# Patient Record
Sex: Female | Born: 1970 | Race: White | Hispanic: No | Marital: Married | Smoking: Never smoker
Health system: Southern US, Community
[De-identification: ages and names within clinical notes are randomized; demographics above are authoritative.]

## PROBLEM LIST (undated history)

## (undated) DIAGNOSIS — E079 Disorder of thyroid, unspecified: Secondary | ICD-10-CM

## (undated) DIAGNOSIS — G43909 Migraine, unspecified, not intractable, without status migrainosus: Secondary | ICD-10-CM

## (undated) DIAGNOSIS — Z9889 Other specified postprocedural states: Secondary | ICD-10-CM

## (undated) HISTORY — DX: Migraine, unspecified, not intractable, without status migrainosus: G43.909

## (undated) HISTORY — DX: Disorder of thyroid, unspecified: E07.9

## (undated) HISTORY — PX: TUBAL LIGATION: SHX77

## (undated) HISTORY — DX: Other specified postprocedural states: Z98.890

## (undated) HISTORY — PX: GASTRIC BYPASS: SHX52

---

## 1998-03-23 ENCOUNTER — Inpatient Hospital Stay (HOSPITAL_COMMUNITY): Admission: AD | Admit: 1998-03-23 | Discharge: 1998-03-23 | Payer: Self-pay | Admitting: Obstetrics and Gynecology

## 1998-04-19 ENCOUNTER — Inpatient Hospital Stay (HOSPITAL_COMMUNITY): Admission: AD | Admit: 1998-04-19 | Discharge: 1998-04-21 | Payer: Self-pay | Admitting: Obstetrics and Gynecology

## 1998-07-14 ENCOUNTER — Ambulatory Visit (HOSPITAL_COMMUNITY): Admission: RE | Admit: 1998-07-14 | Discharge: 1998-07-14 | Payer: Self-pay | Admitting: Obstetrics and Gynecology

## 2004-08-30 ENCOUNTER — Ambulatory Visit (HOSPITAL_COMMUNITY): Admission: RE | Admit: 2004-08-30 | Discharge: 2004-08-30 | Payer: Self-pay | Admitting: Family Medicine

## 2005-02-20 ENCOUNTER — Ambulatory Visit (HOSPITAL_COMMUNITY): Admission: RE | Admit: 2005-02-20 | Discharge: 2005-02-20 | Payer: Self-pay | Admitting: Surgery

## 2005-02-21 ENCOUNTER — Encounter: Admission: RE | Admit: 2005-02-21 | Discharge: 2005-05-22 | Payer: Self-pay | Admitting: Surgery

## 2005-03-02 ENCOUNTER — Ambulatory Visit (HOSPITAL_COMMUNITY): Admission: RE | Admit: 2005-03-02 | Discharge: 2005-03-02 | Payer: Self-pay | Admitting: Surgery

## 2005-03-14 ENCOUNTER — Ambulatory Visit: Payer: Self-pay | Admitting: Psychology

## 2005-06-05 ENCOUNTER — Encounter: Admission: RE | Admit: 2005-06-05 | Discharge: 2005-09-03 | Payer: Self-pay | Admitting: Surgery

## 2005-06-26 ENCOUNTER — Inpatient Hospital Stay (HOSPITAL_COMMUNITY): Admission: RE | Admit: 2005-06-26 | Discharge: 2005-06-28 | Payer: Self-pay | Admitting: Surgery

## 2005-08-16 ENCOUNTER — Ambulatory Visit (HOSPITAL_COMMUNITY): Admission: RE | Admit: 2005-08-16 | Discharge: 2005-08-16 | Payer: Self-pay | Admitting: Surgery

## 2005-08-16 ENCOUNTER — Encounter: Admission: RE | Admit: 2005-08-16 | Discharge: 2005-08-16 | Payer: Self-pay | Admitting: Surgery

## 2005-09-18 ENCOUNTER — Encounter: Admission: RE | Admit: 2005-09-18 | Discharge: 2005-12-17 | Payer: Self-pay | Admitting: Surgery

## 2005-12-04 ENCOUNTER — Encounter: Admission: RE | Admit: 2005-12-04 | Discharge: 2005-12-04 | Payer: Self-pay | Admitting: Family Medicine

## 2006-01-22 ENCOUNTER — Encounter: Admission: RE | Admit: 2006-01-22 | Discharge: 2006-01-22 | Payer: Self-pay | Admitting: Surgery

## 2006-06-24 ENCOUNTER — Encounter: Admission: RE | Admit: 2006-06-24 | Discharge: 2006-09-22 | Payer: Self-pay | Admitting: Surgery

## 2008-04-15 ENCOUNTER — Encounter: Admission: RE | Admit: 2008-04-15 | Discharge: 2008-04-15 | Payer: Self-pay | Admitting: Family

## 2009-04-28 ENCOUNTER — Encounter: Admission: RE | Admit: 2009-04-28 | Discharge: 2009-04-28 | Payer: Self-pay | Admitting: Family Medicine

## 2011-02-02 NOTE — Op Note (Signed)
[name], [name]              ACCOUNT NO.:  [id number]   MEDICAL RECORD NO.:  [id number]          PATIENT TYPE:  INP   LOCATION:  X001                         FACILITY:  [facility]   PHYSICIAN:  Vikki Ports, MDDATE OF BIRTH:  01/03/1971   DATE OF PROCEDURE:  06/26/2005  DATE OF DISCHARGE:                                 OPERATIVE REPORT   PREOPERATIVE DIAGNOSIS:  Morbid obesity.   POSTOPERATIVE DIAGNOSIS:  Morbid obesity.   PROCEDURE:  Upper endoscopy.   ANESTHESIA:  General.   SURGEON:  Dr. Danna Hefty   DESCRIPTION:  At the conclusion of the laparoscopic Roux-en-Y gastric bypass  by Dr. Daphine Deutscher, I introduced the Olympus endoscope transorally.  On entering  the Olympus endoscope, the pouch measured 6 cm, was distended well, and it  was removed.  The remained of the report will be dictated by Dr. Daphine Deutscher.      Vikki Ports, MD  Electronically Signed     KRH/MEDQ  D:  06/26/2005  T:  06/26/2005  Job:  [id number]
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
## 2011-02-02 NOTE — Op Note (Signed)
NAMEKENDALYNN, [name]              ACCOUNT NO.:  [id number]   MEDICAL RECORD NO.:  [id number]          PATIENT TYPE:  INP   LOCATION:  X001                         FACILITY:  [facility]   PHYSICIAN:  Thornton Park. Daphine Deutscher, MD  DATE OF BIRTH:  01-27-1971   DATE OF PROCEDURE:  06/26/2005  DATE OF DISCHARGE:                                 OPERATIVE REPORT   PREOPERATIVE DIAGNOSIS:  Morbid obesity, 5 feet 6, weight 286 today.   DESCRIPTION OF PROCEDURE:  Morbid obesity, 5 feet 6, weight 286 today.   PROCEDURE:  Laparoscopic Roux-en-Y gastric bypass (40 cm biliopancreatic  limb with 100 cm Roux limb, antecolic, antegastric candy cane to the left,  closure of Peterson's defect).   SURGEON:  Thornton Park. Daphine Deutscher, MD.   ASSISTANT:  Vikki Ports, MD   ANESTHESIA:  General endotracheal.   DRAINS:  One JP in the left upper quadrant.   DESCRIPTION OF PROCEDURE:  [name] is a 40 year old lady with a BMI  of approximately 48 who was taken to room one and given general anesthesia.  The abdomen was prepped with Betadine and draped sterilely. I entered the  abdomen through the left upper quadrant with an Optiview technique, 12 mm  trocar without difficulty. The trocars were placed including two in the  right upper quadrant and one slightly to the left of  midline and another 5  mm lateral on the left side. The transverse colon was elevated and the  ligament of Treitz was identified and 40 cm downstream from the ligament of  Treitz were measured with the [facility] and it was divided. The proximal end  looked a little ischemic, so we amputated just the tip of the proximal bowel  again using a white load echelon stapler. A Penrose drain was sutured on the  Roux limb. 100 cm were then counted of Roux limb and then a side-to-side  anastomosis was created aligning the antimesenteric borders (held with a  suture) and placing the Echelon stapler and opening along antemesenteric  border firing the  stapler. The common defect was closed with 2-0 Vicryl's  from above and below tying in the middle. I then coated he closure with  Tisseel. The common defect and the mesenteric defect were closed with  running 2-0 silk.   The omentum was divided; and we went up to create the new gastric pouch.  This was dissected up near the spleen and then I went proximally what  appeared would be 4 cm down on the lesser curvature below a vessel and  dissected in behind the lesser curvature. The Echelon with a bronze  cartridge was inserted and fired. I then placed a second application and  locking down and holding it and then firing it, it seemed to jam and it  looked like a tooth of the Prestige grasper may have been in the jaws of the  stapler. The Echelon was withdrawn and a second application was placed right  along the line and fired. I then buttressed the remnant with a running 2-0  Vicryl in the area where this application had been  made. There was no  evidence of any kind of perforation in either the remnant OR the gastric  pouch. I went ahead and completed the pouch creation again using the Echelon  stapler.  The stomach was completely divided and the proximal staple lines  were coated with Tisseal.   The Roux limb was then brought up and a posterior row was made to the staple  line going back up to the lessor curvature side were the initial firing had  been. An opening was made, the echelon was inserted and fired. The common  defect was closed with running 2-0 Vicryl and then a second layer to that  was made to complete the two-layer anastomosis. We then clamped off the  bowel and Dr. Luan Pulling went up and endoscoped the patient. Prior to doing  that, I did close Peterson's defect with a 2-0 silk. This was sewn through  the mesentery and then  tacked to the colon and to the Roux limb. After the  insufflation with the bowel clamp, I submerged the pouch and no leaks were  noted. There was no  bleeding inside the pouch or bleeding on the outside the  pouch. I then placed a drain up above this into the left upper quadrant  brought out through the left side. It was sutured to the skin with 3-0  nylon. Tisseel was applied to the gastrojejunostomy and some extra was  placed down in the closure of Peterson's defect. The patient seemed to  tolerate the procedure well. The abdomen was deflated and the wounds were  closed with 4-0 Vicryl and also staples. The patient tolerated the procedure  well. She was taken to the recovery room in satisfactory condition.      Thornton Park Daphine Deutscher, MD  Electronically Signed     MBM/MEDQ  D:  06/26/2005  T:  06/26/2005  Job:  [id number]   cc:   Teena Irani. Arlyce Dice, M.D.  Fax: [phone]

## 2011-10-23 ENCOUNTER — Other Ambulatory Visit: Payer: Self-pay | Admitting: Family Medicine

## 2011-10-23 DIAGNOSIS — Z1231 Encounter for screening mammogram for malignant neoplasm of breast: Secondary | ICD-10-CM

## 2011-11-14 ENCOUNTER — Ambulatory Visit
Admission: RE | Admit: 2011-11-14 | Discharge: 2011-11-14 | Disposition: A | Discharge status: Home / Self Care | Payer: 59 | Source: Ambulatory Visit | Attending: Family Medicine | Admitting: Family Medicine

## 2011-11-14 DIAGNOSIS — Z1231 Encounter for screening mammogram for malignant neoplasm of breast: Secondary | ICD-10-CM

## 2013-02-24 ENCOUNTER — Other Ambulatory Visit (HOSPITAL_COMMUNITY): Payer: Self-pay | Admitting: Family Medicine

## 2013-02-24 DIAGNOSIS — Z139 Encounter for screening, unspecified: Secondary | ICD-10-CM

## 2013-02-26 ENCOUNTER — Ambulatory Visit (HOSPITAL_COMMUNITY)
Admission: RE | Admit: 2013-02-26 | Discharge: 2013-02-26 | Disposition: A | Discharge status: Home / Self Care | Payer: 59 | Source: Ambulatory Visit | Attending: Family Medicine | Admitting: Family Medicine

## 2013-02-26 DIAGNOSIS — Z1231 Encounter for screening mammogram for malignant neoplasm of breast: Secondary | ICD-10-CM | POA: Insufficient documentation

## 2013-02-26 DIAGNOSIS — Z139 Encounter for screening, unspecified: Secondary | ICD-10-CM

## 2013-09-28 ENCOUNTER — Other Ambulatory Visit (HOSPITAL_COMMUNITY): Payer: Self-pay | Admitting: Family Medicine

## 2013-09-28 DIAGNOSIS — R109 Unspecified abdominal pain: Secondary | ICD-10-CM

## 2013-09-28 DIAGNOSIS — R197 Diarrhea, unspecified: Secondary | ICD-10-CM

## 2013-10-01 ENCOUNTER — Ambulatory Visit (HOSPITAL_COMMUNITY)
Admission: RE | Admit: 2013-10-01 | Discharge: 2013-10-01 | Disposition: A | Discharge status: Home / Self Care | Payer: 59 | Source: Ambulatory Visit | Attending: Family Medicine | Admitting: Family Medicine

## 2013-10-01 ENCOUNTER — Ambulatory Visit (HOSPITAL_COMMUNITY): Payer: 59

## 2013-10-01 DIAGNOSIS — R109 Unspecified abdominal pain: Secondary | ICD-10-CM

## 2013-10-01 DIAGNOSIS — R1012 Left upper quadrant pain: Secondary | ICD-10-CM | POA: Insufficient documentation

## 2013-10-01 DIAGNOSIS — R197 Diarrhea, unspecified: Secondary | ICD-10-CM

## 2013-10-01 MED ORDER — SODIUM CHLORIDE 0.9 % IJ SOLN
INTRAMUSCULAR | Status: AC
  Filled 2013-10-01: qty 500

## 2013-10-01 MED ORDER — IOHEXOL 300 MG/ML  SOLN
100.0000 mL | Freq: Once | INTRAMUSCULAR | Status: AC | PRN
  Administered 2013-10-01: 100 mL via INTRAVENOUS

## 2013-11-30 ENCOUNTER — Ambulatory Visit (INDEPENDENT_AMBULATORY_CARE_PROVIDER_SITE_OTHER): Payer: 59 | Admitting: General Surgery

## 2013-12-08 ENCOUNTER — Encounter (INDEPENDENT_AMBULATORY_CARE_PROVIDER_SITE_OTHER): Payer: Self-pay

## 2015-01-31 ENCOUNTER — Other Ambulatory Visit (HOSPITAL_COMMUNITY): Payer: Self-pay | Admitting: Family Medicine

## 2015-01-31 DIAGNOSIS — Z1231 Encounter for screening mammogram for malignant neoplasm of breast: Secondary | ICD-10-CM

## 2015-02-28 ENCOUNTER — Ambulatory Visit (HOSPITAL_COMMUNITY)
Admission: RE | Admit: 2015-02-28 | Discharge: 2015-02-28 | Disposition: A | Discharge status: Home / Self Care | Payer: 59 | Source: Ambulatory Visit | Attending: Family Medicine | Admitting: Family Medicine

## 2015-02-28 DIAGNOSIS — Z1231 Encounter for screening mammogram for malignant neoplasm of breast: Secondary | ICD-10-CM | POA: Diagnosis present

## 2017-05-08 ENCOUNTER — Other Ambulatory Visit (HOSPITAL_COMMUNITY): Payer: Self-pay | Admitting: Family Medicine

## 2017-05-08 DIAGNOSIS — Z1231 Encounter for screening mammogram for malignant neoplasm of breast: Secondary | ICD-10-CM

## 2017-05-13 ENCOUNTER — Ambulatory Visit (HOSPITAL_COMMUNITY): Payer: 59

## 2018-07-23 ENCOUNTER — Other Ambulatory Visit (HOSPITAL_COMMUNITY): Payer: Self-pay | Admitting: Family Medicine

## 2018-07-23 DIAGNOSIS — Z1231 Encounter for screening mammogram for malignant neoplasm of breast: Secondary | ICD-10-CM

## 2018-07-28 ENCOUNTER — Ambulatory Visit (HOSPITAL_COMMUNITY)
Admission: RE | Admit: 2018-07-28 | Discharge: 2018-07-28 | Disposition: A | Discharge status: Home / Self Care | Payer: 59 | Source: Ambulatory Visit | Attending: Family Medicine | Admitting: Family Medicine

## 2018-07-28 DIAGNOSIS — Z1231 Encounter for screening mammogram for malignant neoplasm of breast: Secondary | ICD-10-CM | POA: Insufficient documentation

## 2019-11-19 ENCOUNTER — Ambulatory Visit: Payer: 59 | Attending: Internal Medicine

## 2019-11-19 DIAGNOSIS — Z23 Encounter for immunization: Secondary | ICD-10-CM | POA: Insufficient documentation

## 2019-11-19 NOTE — Progress Notes (Signed)
   Covid-19 Vaccination Clinic  Name:  MCKAELA HOWLEY    MRN: [id number] DOB: 1971-07-17  11/19/2019  Ms. Igoe was observed post Covid-19 immunization for 15 minutes without incident. She was provided with Vaccine Information Sheet and instruction to access the V-Safe system.   Ms. Herskowitz was instructed to call 911 with any severe reactions post vaccine: Marland Kitchen Difficulty breathing  . Swelling of face and throat  . A fast heartbeat  . A bad rash all over body  . Dizziness and weakness   Immunizations Administered    Name Date Dose VIS Date Route   Moderna COVID-19 Vaccine 11/19/2019  9:12 AM 0.5 mL 08/18/2019 Intramuscular   Manufacturer: Moderna   Lot: [id number]   NDC: 39532-023-34

## 2019-12-22 ENCOUNTER — Other Ambulatory Visit (HOSPITAL_COMMUNITY): Payer: Self-pay | Admitting: Family Medicine

## 2019-12-22 DIAGNOSIS — Z1231 Encounter for screening mammogram for malignant neoplasm of breast: Secondary | ICD-10-CM

## 2019-12-23 ENCOUNTER — Ambulatory Visit: Payer: 59 | Attending: Internal Medicine

## 2019-12-23 DIAGNOSIS — Z23 Encounter for immunization: Secondary | ICD-10-CM

## 2019-12-23 NOTE — Progress Notes (Signed)
   Covid-19 Vaccination Clinic  Name:  [name]    MRN: [id number] DOB: October 10, 1970  12/23/2019  [name] was observed post Covid-19 immunization for 15 minutes without incident. She was provided with Vaccine Information Sheet and instruction to access the V-Safe system.   [name] was instructed to call 911 with any severe reactions post vaccine: Marland Kitchen Difficulty breathing  . Swelling of face and throat  . A fast heartbeat  . A bad rash all over body  . Dizziness and weakness   Immunizations Administered    Name Date Dose VIS Date Route   Moderna COVID-19 Vaccine 12/23/2019  8:11 AM 0.5 mL 08/18/2019 Intramuscular   Manufacturer: Gala Murdoch   Lot: [id number]   NDC: 93810-175-10

## 2020-02-01 ENCOUNTER — Ambulatory Visit (HOSPITAL_COMMUNITY): Payer: 59

## 2020-02-01 ENCOUNTER — Other Ambulatory Visit: Payer: Self-pay

## 2020-02-01 ENCOUNTER — Ambulatory Visit (HOSPITAL_COMMUNITY)
Admission: RE | Admit: 2020-02-01 | Discharge: 2020-02-01 | Disposition: A | Discharge status: Home / Self Care | Payer: 59 | Source: Ambulatory Visit | Attending: Family Medicine | Admitting: Family Medicine

## 2020-02-01 DIAGNOSIS — Z1231 Encounter for screening mammogram for malignant neoplasm of breast: Secondary | ICD-10-CM | POA: Insufficient documentation

## 2020-05-19 ENCOUNTER — Other Ambulatory Visit (HOSPITAL_COMMUNITY): Payer: Self-pay | Admitting: Family Medicine

## 2020-05-19 ENCOUNTER — Other Ambulatory Visit: Payer: Self-pay | Admitting: Family Medicine

## 2020-05-19 DIAGNOSIS — N63 Unspecified lump in unspecified breast: Secondary | ICD-10-CM

## 2020-05-24 ENCOUNTER — Other Ambulatory Visit: Payer: Self-pay

## 2020-05-24 ENCOUNTER — Ambulatory Visit (HOSPITAL_COMMUNITY)
Admission: RE | Admit: 2020-05-24 | Discharge: 2020-05-24 | Disposition: A | Discharge status: Home / Self Care | Payer: 59 | Source: Ambulatory Visit | Attending: Family Medicine | Admitting: Family Medicine

## 2020-05-24 DIAGNOSIS — N63 Unspecified lump in unspecified breast: Secondary | ICD-10-CM

## 2020-05-25 ENCOUNTER — Other Ambulatory Visit (HOSPITAL_COMMUNITY): Payer: Self-pay | Admitting: Family Medicine

## 2020-05-25 DIAGNOSIS — R928 Other abnormal and inconclusive findings on diagnostic imaging of breast: Secondary | ICD-10-CM

## 2020-06-01 ENCOUNTER — Other Ambulatory Visit: Payer: Self-pay

## 2020-06-01 ENCOUNTER — Ambulatory Visit (HOSPITAL_COMMUNITY)
Admission: RE | Admit: 2020-06-01 | Discharge: 2020-06-01 | Disposition: A | Discharge status: Home / Self Care | Payer: 59 | Source: Ambulatory Visit | Attending: Family Medicine | Admitting: Family Medicine

## 2020-06-01 DIAGNOSIS — R928 Other abnormal and inconclusive findings on diagnostic imaging of breast: Secondary | ICD-10-CM | POA: Diagnosis present

## 2020-06-01 DIAGNOSIS — R599 Enlarged lymph nodes, unspecified: Secondary | ICD-10-CM | POA: Diagnosis not present

## 2020-06-01 MED ORDER — LIDOCAINE-EPINEPHRINE (PF) 1 %-1:200000 IJ SOLN
INTRAMUSCULAR | Status: AC
  Filled 2020-06-01: qty 30

## 2020-06-01 MED ORDER — LIDOCAINE HCL (PF) 2 % IJ SOLN
INTRAMUSCULAR | Status: AC
  Filled 2020-06-01: qty 10

## 2020-06-06 LAB — SURGICAL PATHOLOGY

## 2020-06-07 ENCOUNTER — Other Ambulatory Visit (HOSPITAL_COMMUNITY): Payer: 59

## 2021-01-18 LAB — EXTERNAL GENERIC LAB PROCEDURE: COLOGUARD: NEGATIVE

## 2021-09-21 IMAGING — MG MM DIGITAL DIAGNOSTIC UNILAT*L* W/ TOMO W/ CAD
8 series · 8 of 24 positions shown · non-contrast
Comparison: Previous exams.

CLINICAL DATA: 49-year-old female with a palpable area of concern
in the left axilla. She noticed this palpable area of concern in the
left axilla approximately 1 week ago which is associated with
tenderness.

EXAM:
DIGITAL DIAGNOSTIC UNILATERAL LEFT MAMMOGRAM WITH TOMO AND CAD;
ULTRASOUND LEFT BREAST LIMITED

[L CC synth-2D]
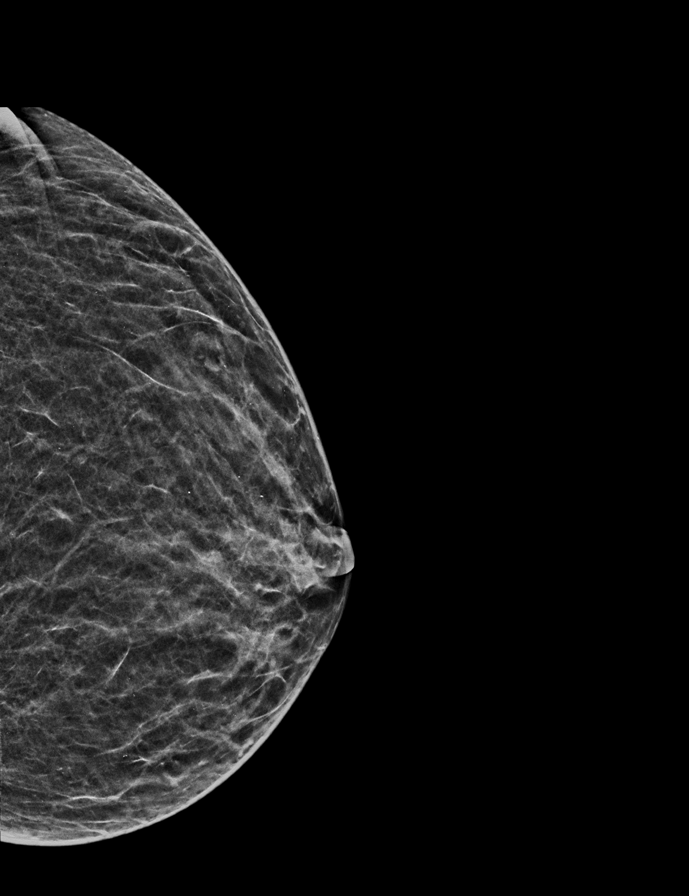

[L MLO synth-2D (1 of 3)]
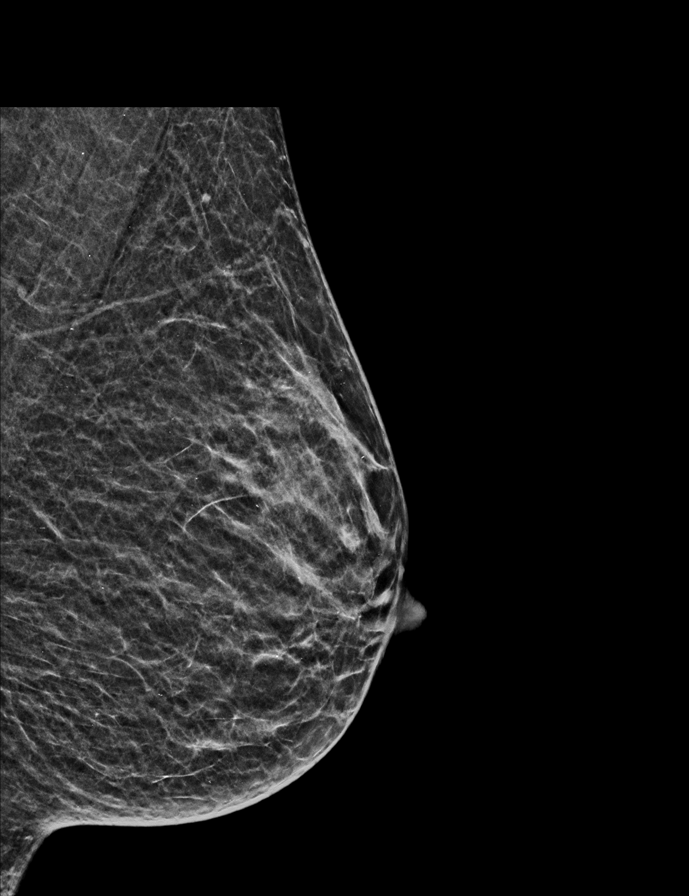

[L MLO synth-2D (2 of 3)]
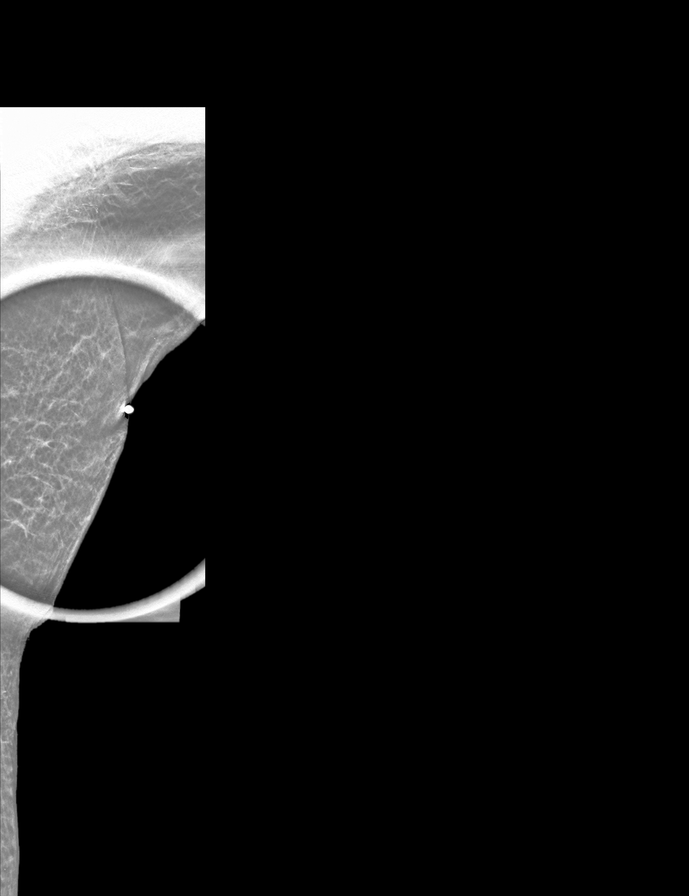

[L MLO synth-2D (3 of 3)]
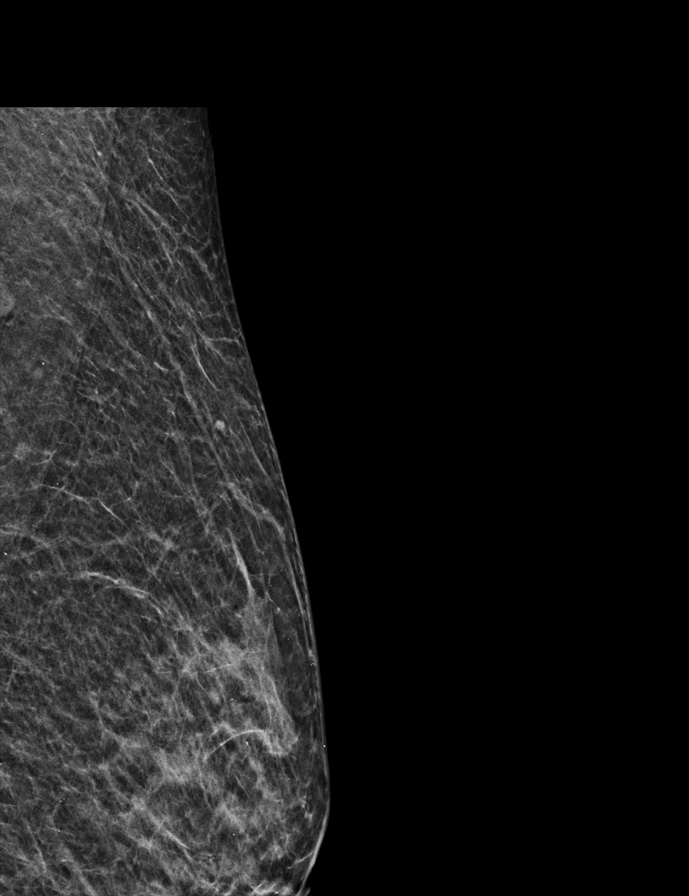

[L MLO tomo (1 of 3) · tomo slice 17/34.0]
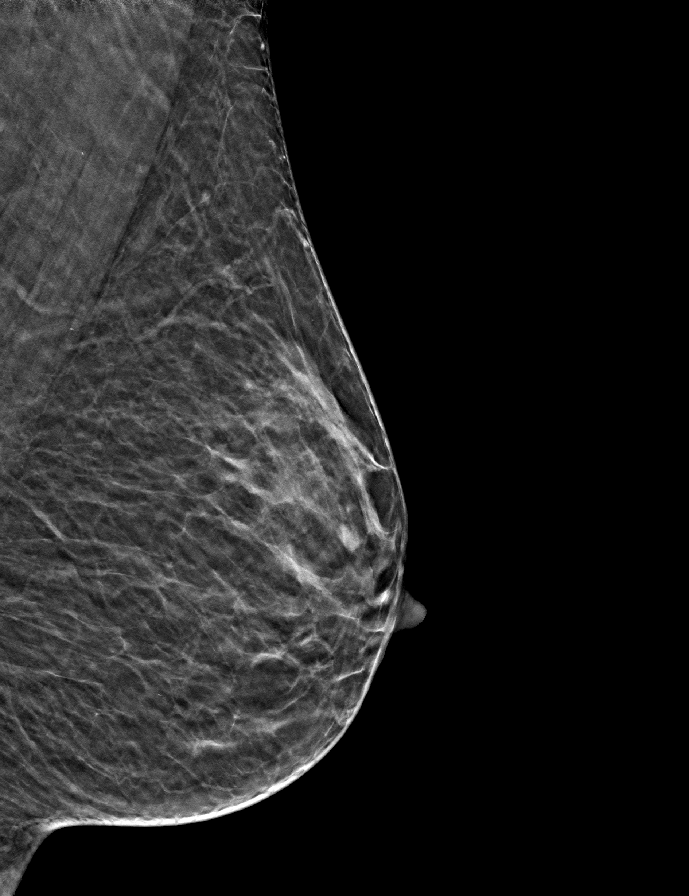

[L MLO tomo (2 of 3) · tomo slice 17/34.0]
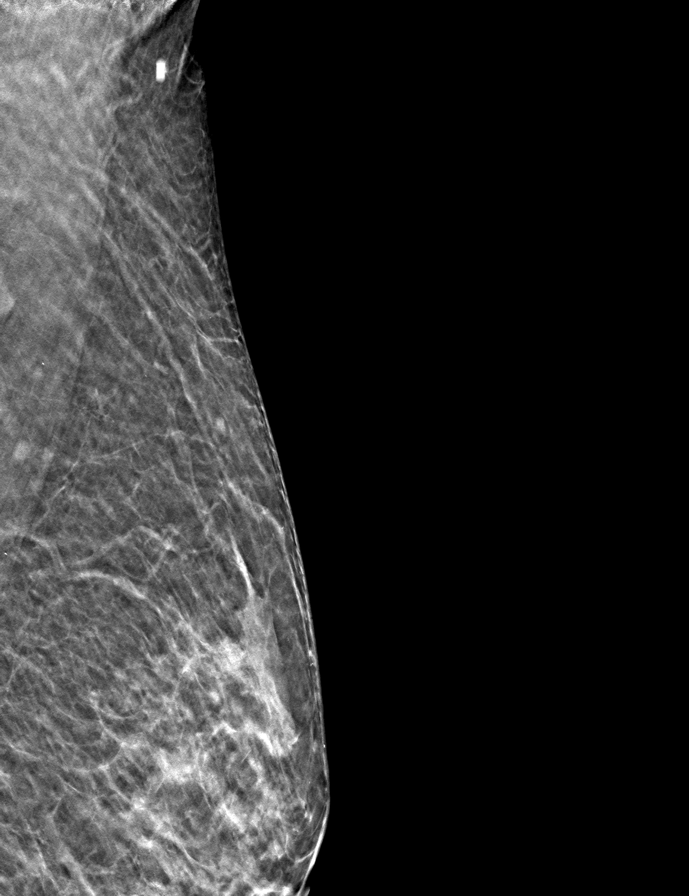

[L MLO tomo (3 of 3) · tomo slice 11/21.0]
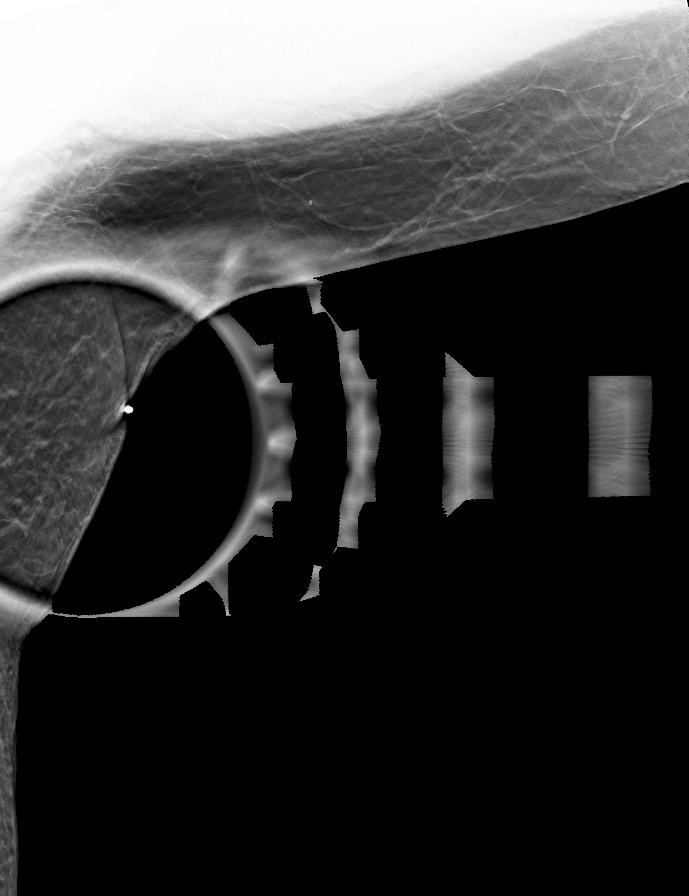

[L CC tomo · tomo slice 19/37.0]
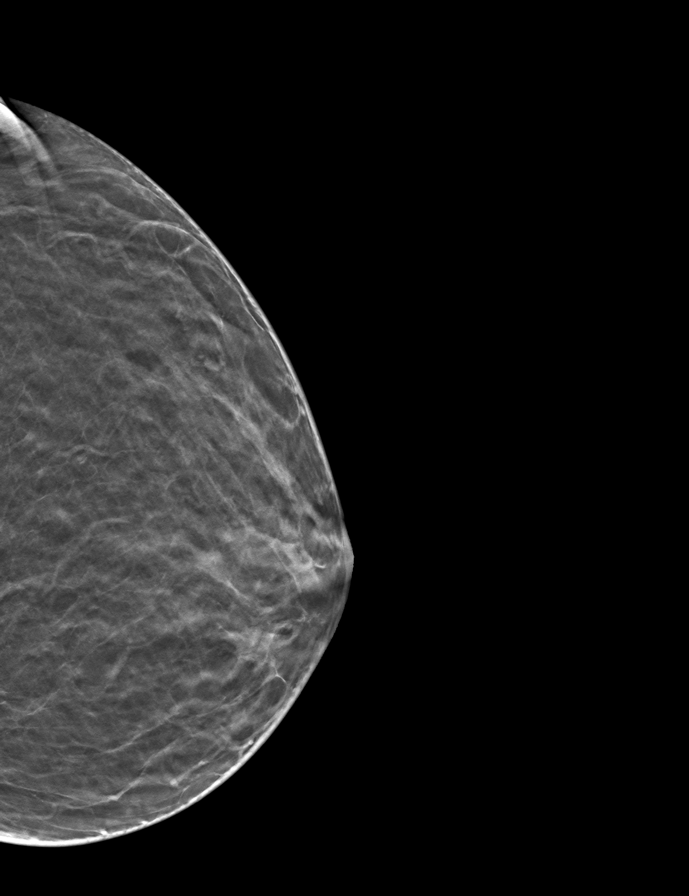

[8 of 24 positions shown; findings below may reference images not displayed]

ACR Breast Density Category c: The breast tissue is heterogeneously
dense, which may obscure small masses.
FINDINGS: No suspicious masses or calcifications seen in the left breast. Spot
compression tomograms were performed over the palpable area concern
in the left axilla with no definite abnormality seen.

Mammographic images were processed with CAD.

Physical examination at site of palpable concern reveals a a
slightly mobile mass within the deep axilla.

Targeted ultrasound of the left axilla was performed demonstrating
at least 3 morphologically abnormal lymph nodes, 1 of which measures
2.2 x 1.4 x 2.3 cm with a cortex thickness of 0.5 cm. A brief scan
of the right axilla demonstrates few small lymph nodes, all of
normal morphology.
IMPRESSION: Palpable left axillary lymphadenopathy.

RECOMMENDATION:
Recommend ultrasound-guided biopsy of 1 of the morphologically
abnormal lymph nodes in the left axilla. This is scheduled for
06/07/2020 at 1 p.m.

I have discussed the findings and recommendations with the patient.
If applicable, a reminder letter will be sent to the patient
regarding the next appointment.

BI-RADS CATEGORY  4: Suspicious.

## 2021-09-29 IMAGING — US US BREAST BX W LOC DEV 1ST LESION IMG BX SPEC US GUIDE*L*
1 series · 12 of 15 positions shown · non-contrast
Comparison: Previous exam(s).
COMPARISON: Previous exam(s).

Addendum:
CLINICAL DATA: 49-year-old female with unilateral left axillary
lymphadenopathy.

EXAM:
US AXILLARY NODE CORE BIOPSY LEFT

[Series 1: us breast bx w loc dev 1st lesion img bx spec us g · 0.07mm/px · 12 of 15 slices shown]
[im 1/15]
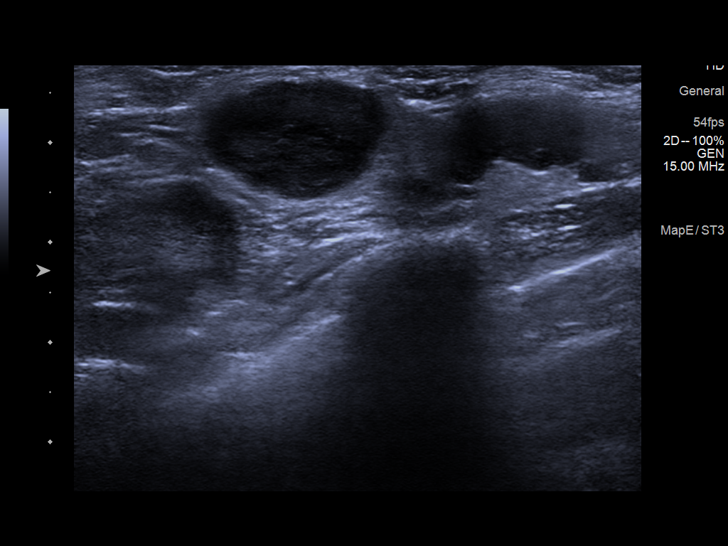
[im 2/15]
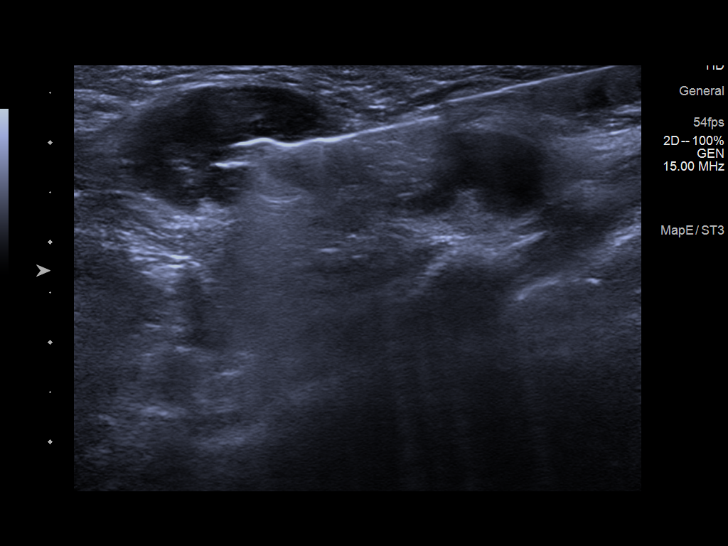
[im 4/15]
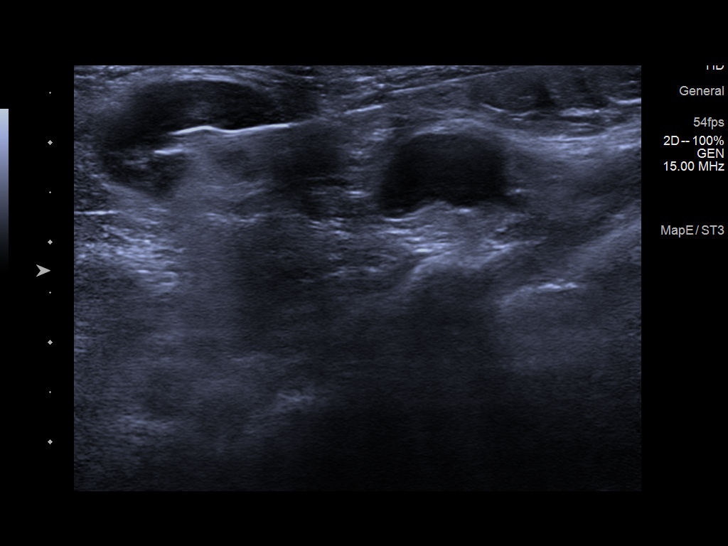
[im 5/15]
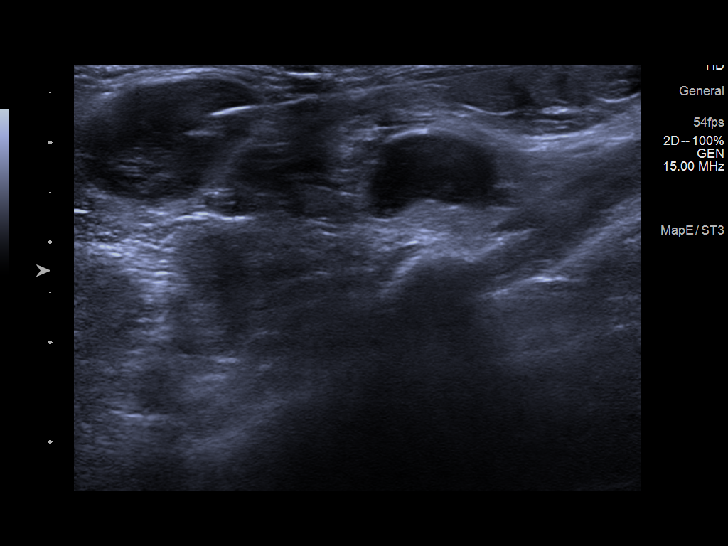
[im 6/15]
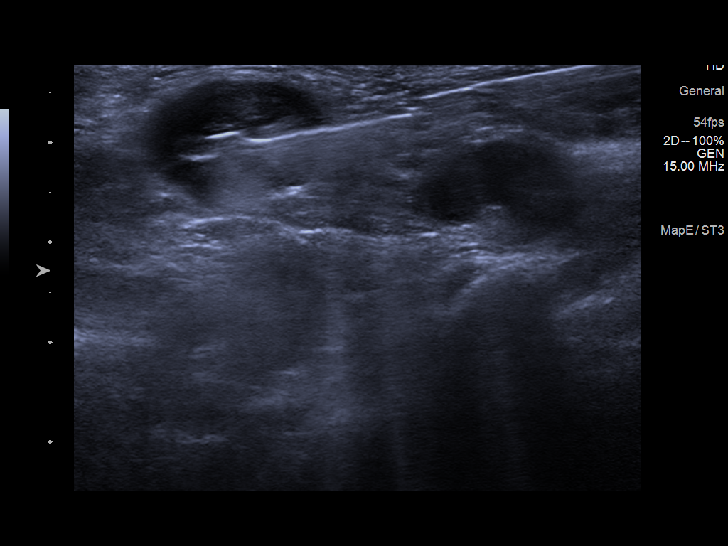
[im 7/15]
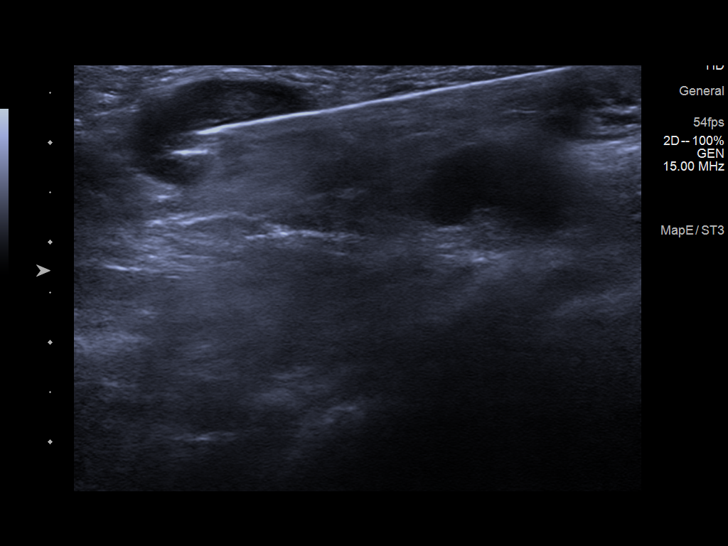
[im 9/15]
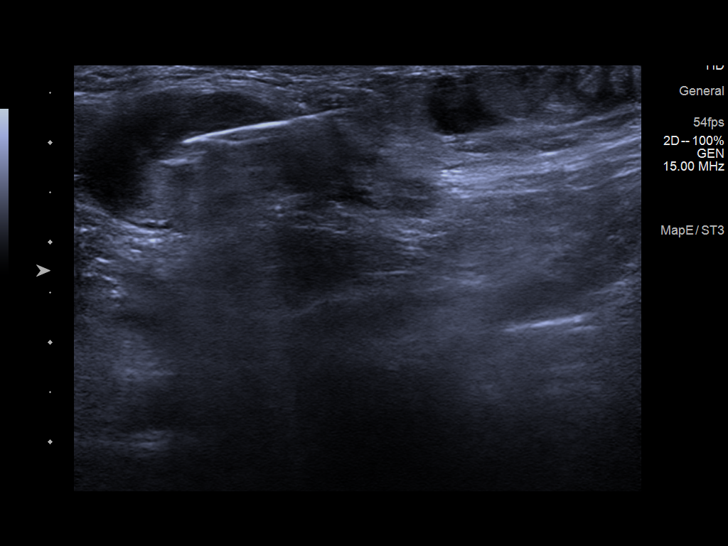
[im 10/15]
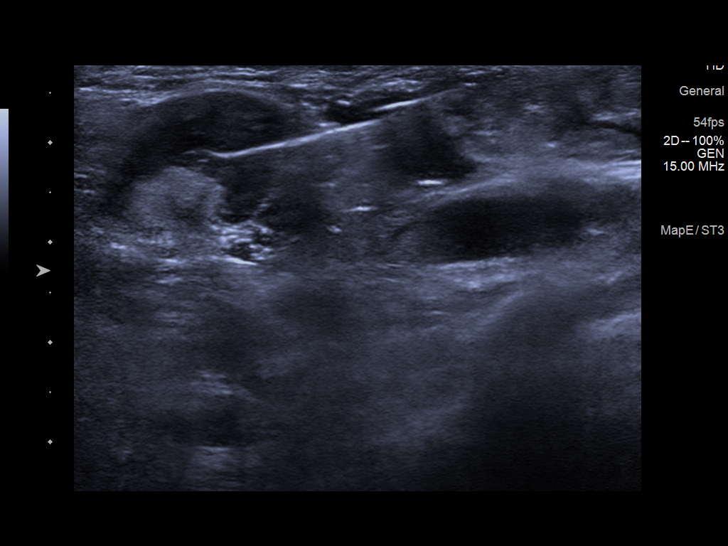
[im 11/15]
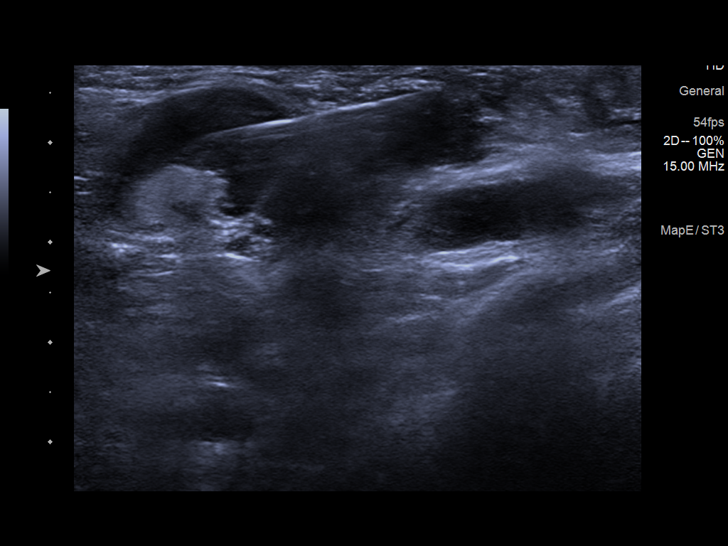
[im 12/15]
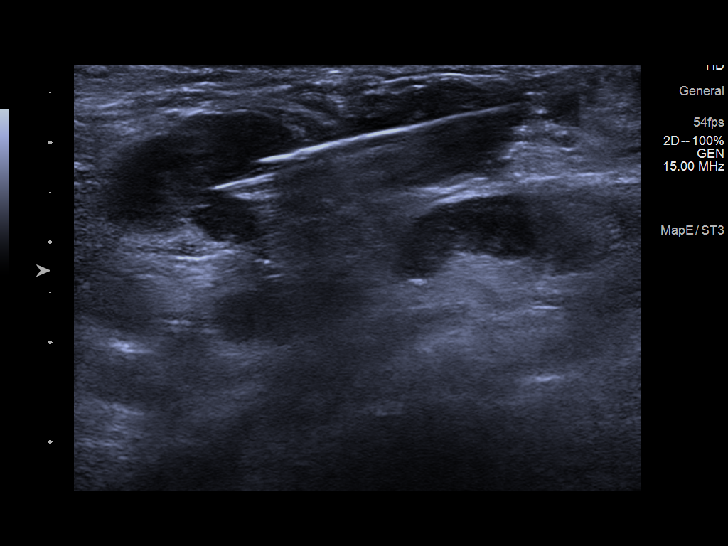
[im 14/15]
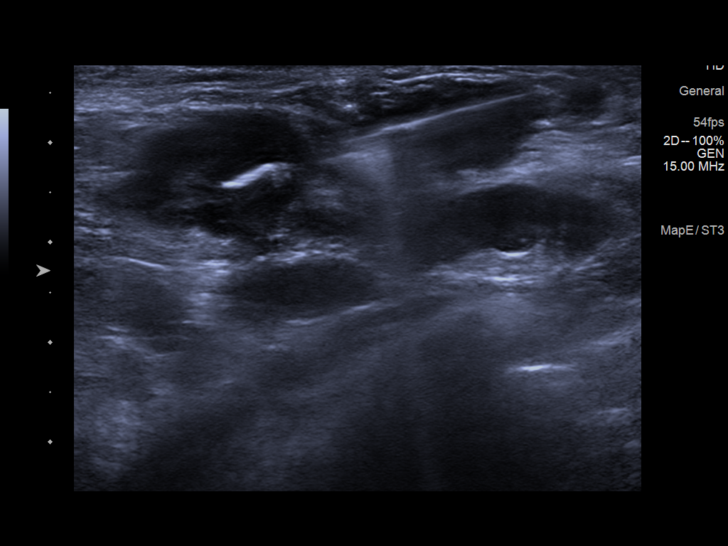
[im 15/15]
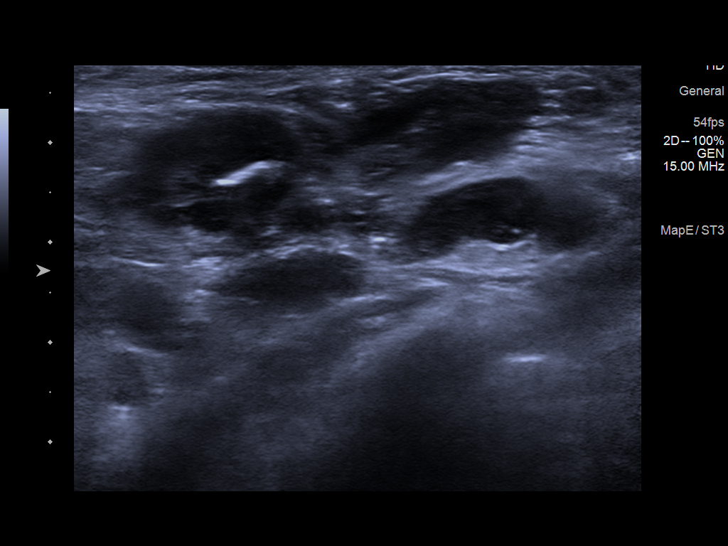

[12 of 15 positions shown; findings below may reference images not displayed]



Using sterile technique and 1% Lidocaine as local anesthetic, under
direct ultrasound visualization, a 14 gauge Yonchano device was
used to perform biopsy of 1 of the abnormal lymph nodes in the left
axilla using a inferolateral to superomedial approach. At the
conclusion of the procedure a HydroMARK tissue marker clip was
deployed into the biopsy cavity. Follow up 2 view mammogram was
performed and dictated separately.
IMPRESSION: Ultrasound guided biopsy of 1 of the abnormal lymph nodes in the
left axilla. No apparent complications.

ADDENDUM:
PATHOLOGY revealed: A. LYMPH NODE, AXILLA, LEFT, BIOPSY: - Reactive
lymph node. - No carcinoma identified. COMMENT: Core biopsies of
lymph node reveal overall intact architecture with a few reactive
Yo Mariee. There focal non-caseating granulomas and capsular
fibrosis. Immunohistochemistry reveals cortical B-cell areas (CD20,
CHOLA LITHOS5) and paracortical T-cells (CD3). CD15 highlights scattered
granulocytes. CD30 is positive in scattered activated cells. Flow
cytometry ([id number]) is negative for a monoclonal B-cell or
phenotypically aberrant T-cell population. While the pattern is
non-specific, the overall findings are consistent with a reactive
lymph node.

Pathology results are CONCORDANT with imaging findings, per Dr.
Sawyer Gzz.

Pathology results and recommendations below were discussed with
patient by telephone on 06/06/2020. Patient reported biopsy site
within normal limits with slight tenderness at the site. Post biopsy
care instructions were reviewed, questions were answered and my
direct phone number was provided to patient. Patient was instructed
to call [HOSPITAL] [HOSPITAL] Mammography Department if
any concerns or questions arise related to the biopsy.

Recommendation: Patient instructed to resume annual bilateral
screening mammogram due January 2021.

Pathology results reported by Olimpia Tiger RN on 06/07/2020.



Using sterile technique and 1% Lidocaine as local anesthetic, under
direct ultrasound visualization, a 14 gauge Yonchano device was
used to perform biopsy of 1 of the abnormal lymph nodes in the left
axilla using a inferolateral to superomedial approach. At the
conclusion of the procedure a HydroMARK tissue marker clip was
deployed into the biopsy cavity. Follow up 2 view mammogram was
performed and dictated separately.
IMPRESSION: Ultrasound guided biopsy of 1 of the abnormal lymph nodes in the
left axilla. No apparent complications.

## 2021-12-26 ENCOUNTER — Other Ambulatory Visit (HOSPITAL_COMMUNITY): Payer: Self-pay | Admitting: Family Medicine

## 2021-12-26 DIAGNOSIS — Z1231 Encounter for screening mammogram for malignant neoplasm of breast: Secondary | ICD-10-CM

## 2021-12-29 ENCOUNTER — Encounter: Payer: Self-pay | Admitting: Neurology

## 2022-01-01 ENCOUNTER — Ambulatory Visit (HOSPITAL_COMMUNITY): Payer: 59

## 2022-01-01 ENCOUNTER — Ambulatory Visit (HOSPITAL_COMMUNITY)
Admission: RE | Admit: 2022-01-01 | Discharge: 2022-01-01 | Disposition: A | Discharge status: Home / Self Care | Payer: 59 | Source: Ambulatory Visit | Attending: Family Medicine | Admitting: Family Medicine

## 2022-01-01 DIAGNOSIS — Z1231 Encounter for screening mammogram for malignant neoplasm of breast: Secondary | ICD-10-CM | POA: Diagnosis present

## 2022-03-12 ENCOUNTER — Ambulatory Visit (INDEPENDENT_AMBULATORY_CARE_PROVIDER_SITE_OTHER): Payer: 59 | Admitting: Neurology

## 2022-03-12 ENCOUNTER — Encounter: Payer: Self-pay | Admitting: Neurology

## 2022-03-12 VITALS — BP 118/74 | HR 59 | Ht 65.0 in | Wt 160.2 lb

## 2022-03-12 DIAGNOSIS — G43009 Migraine without aura, not intractable, without status migrainosus: Secondary | ICD-10-CM | POA: Diagnosis not present

## 2022-03-12 MED ORDER — SUMATRIPTAN SUCCINATE 100 MG PO TABS: 100.0000 mg | ORAL_TABLET | Freq: Once | ORAL | 2 refills | Status: DC | PRN

## 2022-03-12 MED ORDER — NORTRIPTYLINE HCL 10 MG PO CAPS: 10.0000 mg | ORAL_CAPSULE | Freq: Every day | ORAL | 5 refills | Status: DC

## 2022-03-26 ENCOUNTER — Ambulatory Visit
Admission: RE | Admit: 2022-03-26 | Discharge: 2022-03-26 | Disposition: A | Discharge status: Home / Self Care | Payer: 59 | Source: Ambulatory Visit | Attending: Neurology | Admitting: Neurology

## 2022-03-26 DIAGNOSIS — G43009 Migraine without aura, not intractable, without status migrainosus: Secondary | ICD-10-CM

## 2022-03-26 MED ORDER — GADOBENATE DIMEGLUMINE 529 MG/ML IV SOLN
15.0000 mL | Freq: Once | INTRAVENOUS | Status: AC | PRN
  Administered 2022-03-26: 15 mL via INTRAVENOUS

## 2022-03-27 ENCOUNTER — Telehealth: Payer: Self-pay

## 2022-03-27 NOTE — Telephone Encounter (Signed)
Called patient to give her the MRI results and she wanted to ask me if [facility] had contacted [place]. There is a contraindication with the Nortriptyline and the Bupropion. Is there another medication you would like to use for this patient?

## 2022-03-29 NOTE — Telephone Encounter (Signed)
Called pharmacy and they are going to fill the Nortriptyline with he authorization of Dr. Everlena Cooper

## 2022-06-06 ENCOUNTER — Other Ambulatory Visit: Payer: Self-pay | Admitting: Neurology

## 2022-07-20 NOTE — Progress Notes (Unsigned)
NEUROLOGY FOLLOW UP OFFICE NOTE  MAIRLYN TEGTMEYER [id number]  Assessment/Plan:   Migraine without aura, without status migrainosus, not intractable   Migraine prevention:  Will instead start propranolol ER 60mg  daily.  Continue topiramate 100mg  BID Migraine rescue:  Stop sumatriptan.  She will try rizatriptan 10mg . Limit use of pain relievers to no more than 2 days out of week to prevent risk of rebound or medication-overuse headache. Keep headache diary Follow up 4 to 5 months.       Subjective:  LELA MURFIN is a 51 year old female with hypothyroidism who follows up for migraines.  UPDATE MRI of brain with and without contrast on 03/26/2022 personally reviewed  was unremarkable.  Plan was to start nortriptyline.  She never started it.  Her pharmacy would not fill it (presumably due to possible interaction with sertraline).   Intensity:  severe Duration:  2 hours on average (60% of time needs to repeat dose) Frequency:  9 days a month. Current NSAIDS/analgesics:  none Current triptans:  sumatriptan 100mg  Current ergotamine:  none Current anti-emetic:  none Current muscle relaxants:  none Current Antihypertensive medications:  none Current Antidepressant medications:  Wellbutrin Current Anticonvulsant medications:  topiramate 100mg  BID Current anti-CGRP:  none Current Vitamins/Herbal/Supplements:  none Current Antihistamines/Decongestants:  none Other therapy:  beaded ice pack birth control:  none Other medications:  levothyroxine  Caffeine:  Tea.  No coffee Diet:  Drinks a lot of water.  Occasional soda.  Does not skip meals Exercise:  not Depression:  depression; Anxiety:  depression Other pain:  none Sleep hygiene:  light sleeper.  Wakes up often  HISTORY:  Onset:  in her 32s. Location:  sides of head bilaterally Quality:  throbbing Intensity:  8-9/10. More recently waking her up at night.  Aura:  absent Prodrome:  absent   Associated symptoms:  None.   She denies associated nausea, vomiting, photophobia, phonophobia, osmophobia, visual disturbance, autonomic symptomsunilateral numbness or weakness. Duration:  4-5 hours or longer Frequency:  6-7 days a month Triggers:  Unknown.  Laying down makes it worse Relieving factors:  Sitting up, beaded ice pack on head/face Activity:  aggravates   Remote MRI of brain from 08/31/2004 personally reviewed was normal.      Past NSAIDS/analgesics:  ibuprofen, Tylenol, Goody powder Past abortive triptans:  none Past abortive ergotamine:  none Past muscle relaxants:  none Past anti-emetic:  none Past antihypertensive medications:  none Past antidepressant medications:  sertraline Past anticonvulsant medications:  none Past anti-CGRP:  none Other past therapies:  none    Family history of headache:  cousin  PAST MEDICAL HISTORY: Past Medical History:  Diagnosis Date   History of endometrial ablation    Migraines    Thyroid disease     MEDICATIONS: Current Outpatient Medications on File Prior to Visit  Medication Sig Dispense Refill   buPROPion (WELLBUTRIN XL) 300 MG 24 hr tablet Take 300 mg by mouth every morning.     levothyroxine (SYNTHROID) 88 MCG tablet Take 88 mcg by mouth daily.     topiramate (TOPAMAX) 50 MG tablet Take 100 mg by mouth 2 (two) times daily.     No current facility-administered medications on file prior to visit.     ALLERGIES: No Known Allergies  FAMILY HISTORY: Family History  Problem Relation Age of Onset   Ataxia Neg Hx    Chorea Neg Hx    Dementia Neg Hx    Mental retardation Neg Hx    Migraines  Neg Hx    Multiple sclerosis Neg Hx    Neurofibromatosis Neg Hx    Neuropathy Neg Hx    Parkinsonism Neg Hx    Seizures Neg Hx    Stroke Neg Hx       Objective:  Blood pressure 126/80, pulse 60, height 5\' 5"  (1.651 m), weight 155 lb (70.3 kg), SpO2 98 %. General: No acute distress.  Patient appears well-groomed.    , DO  CC: Shon Millet, PA-C

## 2022-07-23 ENCOUNTER — Encounter: Payer: Self-pay | Admitting: Neurology

## 2022-07-23 ENCOUNTER — Ambulatory Visit (INDEPENDENT_AMBULATORY_CARE_PROVIDER_SITE_OTHER): Payer: 59 | Admitting: Neurology

## 2022-07-23 VITALS — BP 126/80 | HR 60 | Ht 65.0 in | Wt 155.0 lb

## 2022-07-23 DIAGNOSIS — G43009 Migraine without aura, not intractable, without status migrainosus: Secondary | ICD-10-CM | POA: Diagnosis not present

## 2022-07-23 MED ORDER — RIZATRIPTAN BENZOATE 10 MG PO TABS: 10.0000 mg | ORAL_TABLET | ORAL | 5 refills | Status: DC | PRN

## 2022-07-23 MED ORDER — PROPRANOLOL HCL ER 60 MG PO CP24: 60.0000 mg | ORAL_CAPSULE | Freq: Every day | ORAL | 5 refills | Status: DC

## 2022-07-23 NOTE — Patient Instructions (Signed)
Start propranolol ER 60mg  daily.  If no improvement in 4 weeks, contact me  Stop sumatriptan.  Instead, try rizatriptan.  May repeat after 2 hours.  Maximum 2 tablets in 24 hours. Follow up 4-5 months.

## 2022-10-24 ENCOUNTER — Other Ambulatory Visit: Payer: Self-pay | Admitting: Neurology

## 2023-01-08 NOTE — Progress Notes (Unsigned)
NEUROLOGY FOLLOW UP OFFICE NOTE  GESELLE CARDOSA [id number]  Assessment/Plan:   Migraine without aura, without status migrainosus, not intractable   Migraine prevention:  propranolol ER  daily, topiramate  BID *** Migraine rescue:  rizatriptan  *** Limit use of pain relievers to no more than 2 days out of week to prevent risk of rebound or medication-overuse headache. Keep headache diary Follow up ***       Subjective:  [name] is a 51 year old female with hypothyroidism who follows up for migraines.   UPDATE Started propranolol in November. Intensity:  severe Duration:  2 hours on average (60% of time needs to repeat dose) Frequency:  9 days a month. Current NSAIDS/analgesics:  none Current triptans:  rizatriptan  Current ergotamine:  none Current anti-emetic:  none Current muscle relaxants:  none Current Antihypertensive medications:  propranolol ER  daily Current Antidepressant medications:  Wellbutrin Current Anticonvulsant medications:  topiramate  BID Current anti-CGRP:  none Current Vitamins/Herbal/Supplements:  none Current Antihistamines/Decongestants:  none Other therapy:  beaded ice pack birth control:  none Other medications:  levothyroxine   Caffeine:  Tea.  No coffee Diet:  Drinks a lot of water.  Occasional soda.  Does not skip meals Exercise:  not Depression:  depression; Anxiety:  depression Other pain:  none Sleep hygiene:  light sleeper.  Wakes up often   HISTORY:  Onset:  in her 30s. Location:  sides of head bilaterally Quality:  throbbing Intensity:  8-9/10. More recently waking her up at night.  Aura:  absent Prodrome:  absent Associated symptoms:  None.  She denies associated nausea, vomiting, photophobia, phonophobia, osmophobia, visual disturbance, autonomic symptomsunilateral numbness or weakness. Duration:  4-5 hours or longer Frequency:  6-7 days a month Triggers:  Unknown.  Laying down makes  it worse Relieving factors:  Sitting up, beaded ice pack on head/face Activity:  aggravates   MRI of brain with and without contrast on 03/26/2022 was unremarkable.       Past NSAIDS/analgesics:  ibuprofen, Tylenol, Goody powder Past abortive triptans:  sumatriptan tab Past abortive ergotamine:  none Past muscle relaxants:  none Past anti-emetic:  none Past antihypertensive medications:  none Past antidepressant medications:  sertraline Past anticonvulsant medications:  none Past anti-CGRP:  none Other past therapies:  none     Family history of headache:  cousin  PAST MEDICAL HISTORY: Past Medical History:  Diagnosis Date   History of endometrial ablation    Migraines    Thyroid disease     MEDICATIONS: Current Outpatient Medications on File Prior to Visit  Medication Sig Dispense Refill   buPROPion (WELLBUTRIN XL) 300 MG 24 hr tablet Take 300 mg by mouth every morning.     levothyroxine (SYNTHROID) 88 MCG tablet Take 88 mcg by mouth daily.     propranolol ER (INDERAL LA) 60 MG 24 hr capsule TAKE 1 CAPSULE BY MOUTH EVERY DAY 90 capsule 1   rizatriptan (MAXALT) 10 MG tablet Take 1 tablet (10 mg total) by mouth as needed for migraine. May repeat in 2 hours if needed.  Maximum 2 tablets in 24 hours. 10 tablet 5   topiramate (TOPAMAX) 50 MG tablet Take 100 mg by mouth 2 (two) times daily.     No current facility-administered medications on file prior to visit.    ALLERGIES: No Known Allergies  FAMILY HISTORY: Family History  Problem Relation Age of Onset   Ataxia Neg Hx    Chorea Neg Hx  Dementia Neg Hx    Mental retardation Neg Hx    Migraines Neg Hx    Multiple sclerosis Neg Hx    Neurofibromatosis Neg Hx    Neuropathy Neg Hx    Parkinsonism Neg Hx    Seizures Neg Hx    Stroke Neg Hx       Objective:  *** General: No acute distress.  Patient appears ***-groomed.   Head:  Normocephalic/atraumatic Eyes:  Fundi examined but not visualized Neck: supple,  no paraspinal tenderness, full range of motion Heart:  Regular rate and rhythm Lungs:  Clear to auscultation bilaterally Back: No paraspinal tenderness Neurological Exam: alert and oriented to person, place, and time.  Speech fluent and not dysarthric, language intact.  CN II-XII intact. Bulk and tone normal, muscle strength 5/5 throughout.  Sensation to light touch intact.  Deep tendon reflexes 2+ throughout, toes downgoing.  Finger to nose testing intact.  Gait normal, Romberg negative.   Shon Millet, DO  CC: ***

## 2023-01-09 ENCOUNTER — Ambulatory Visit (INDEPENDENT_AMBULATORY_CARE_PROVIDER_SITE_OTHER): Payer: 59 | Admitting: Neurology

## 2023-01-09 ENCOUNTER — Encounter: Payer: Self-pay | Admitting: Neurology

## 2023-01-09 VITALS — BP 110/62 | HR 54 | Ht 65.0 in | Wt 170.0 lb

## 2023-01-09 DIAGNOSIS — G43009 Migraine without aura, not intractable, without status migrainosus: Secondary | ICD-10-CM | POA: Diagnosis not present

## 2023-01-09 MED ORDER — NURTEC 75 MG PO TBDP: 75.0000 mg | ORAL_TABLET | ORAL | 11 refills | Status: DC

## 2023-01-09 MED ORDER — RIZATRIPTAN BENZOATE 10 MG PO TABS: 10.0000 mg | ORAL_TABLET | ORAL | 3 refills | Status: DC | PRN

## 2023-01-09 NOTE — Patient Instructions (Signed)
Stop propranolol.  Start Nurtec 1 tablet every other day Continue topiramate Rizatriptan as needed.  Limit use of pain relievers to no more than 2 days out of week to prevent risk of rebound or medication-overuse headache. Keep headache diary

## 2023-01-09 NOTE — Progress Notes (Signed)
Medication Samples have been provided to the patient.  Drug name: Nurtec       Strength: 75 mg        Qty: 6  LOT: [id number]  Exp.Date: 3/26  Dosing instructions: as needed  The patient has been instructed regarding the correct time, dose, and frequency of taking this medication, including desired effects and most common side effects.   Leida Lauth 3:39 PM 01/09/2023

## 2023-01-23 ENCOUNTER — Encounter: Payer: Self-pay | Admitting: Neurology

## 2023-01-28 ENCOUNTER — Other Ambulatory Visit: Payer: Self-pay | Admitting: Neurology

## 2023-01-28 MED ORDER — AJOVY 225 MG/1.5ML ~~LOC~~ SOAJ
225.0000 mg | SUBCUTANEOUS | 11 refills | Status: DC
Start: 2023-01-28 — End: 2023-04-17

## 2023-03-11 ENCOUNTER — Encounter: Payer: Self-pay | Admitting: Neurology

## 2023-04-13 ENCOUNTER — Encounter: Payer: Self-pay | Admitting: Neurology

## 2023-04-17 ENCOUNTER — Other Ambulatory Visit: Payer: Self-pay

## 2023-04-17 MED ORDER — AJOVY 225 MG/1.5ML ~~LOC~~ SOAJ: 225.0000 mg | SUBCUTANEOUS | 1 refills | Status: DC

## 2023-07-12 ENCOUNTER — Ambulatory Visit: Payer: 59 | Admitting: Neurology

## 2023-07-15 NOTE — Progress Notes (Unsigned)
NEUROLOGY FOLLOW UP OFFICE NOTE  [name] [id number]  Assessment/Plan:   Migraine without aura, without status migrainosus, not intractable - migraines appear to have increased.   Migraine prevention:  Ajovy *** Migraine rescue:  rizatriptan 10mg  *** Limit use of pain relievers to no more than 2 days out of week to prevent risk of rebound or medication-overuse headache. Keep headache diary Follow up 6 months.       Subjective:  [name] is a 52 year old female with hypothyroidism who follows up for migraines.   UPDATE Started Nurtec every other day for preventative, but headaches became more frequent.  Switched to [employer]  Intensity:  more mild than severe Duration:  30-40 minutes  Frequency:  10-15 days a month. Current NSAIDS/analgesics:  none Current triptans:  rizatriptan 10mg  Current ergotamine:  none Current anti-emetic:  none Current muscle relaxants:  none Current Antihypertensive medications:  none Current Antidepressant medications:  Wellbutrin Current Anticonvulsant medications:  topiramate 100mg  BID Current anti-CGRP: Ajovy Current Vitamins/Herbal/Supplements:  none Current Antihistamines/Decongestants:  none Other therapy:  beaded ice pack birth control:  none Other medications:  levothyroxine   Caffeine:  Tea.  No coffee Diet:  Drinks a lot of water.  Occasional soda.  Does not skip meals Exercise:  not Depression:  depression; Anxiety:  depression Other pain:  none Sleep hygiene:  light sleeper.  Wakes up often   HISTORY:  Onset:  in her 30s. Location:  sides of head bilaterally Quality:  throbbing Intensity:  8-9/10. More recently waking her up at night.  Aura:  absent Prodrome:  absent Associated symptoms:  None.  She denies associated nausea, vomiting, photophobia, phonophobia, osmophobia, visual disturbance, autonomic symptomsunilateral numbness or weakness. Duration:  4-5 hours or longer Frequency:  6-7 days a  month Triggers:  Unknown.  Laying down makes it worse Relieving factors:  Sitting up, beaded ice pack on head/face Activity:  aggravates   MRI of brain with and without contrast on 03/26/2022 was unremarkable.       Past NSAIDS/analgesics:  ibuprofen, Tylenol, Goody powder Past abortive triptans:  sumatriptan tab Past abortive ergotamine:  none Past muscle relaxants:  none Past anti-emetic:  none Past antihypertensive medications:  propranolol (hypotension) Past antidepressant medications:  sertraline Past anticonvulsant medications:  none Past anti-CGRP:  Nurtec QOD Other past therapies:  none     Family history of headache:  cousin  PAST MEDICAL HISTORY: Past Medical History:  Diagnosis Date   History of endometrial ablation    Migraines    Thyroid disease     MEDICATIONS: Current Outpatient Medications on File Prior to Visit  Medication Sig Dispense Refill   buPROPion (WELLBUTRIN XL) 300 MG 24 hr tablet Take 300 mg by mouth every morning.     Fremanezumab-vfrm (AJOVY) 225 MG/1.5ML SOAJ Inject 225 mg into the skin every 28 (twenty-eight) days. 3 mL 1   levothyroxine (SYNTHROID) 88 MCG tablet Take 88 mcg by mouth daily.     Rimegepant Sulfate (NURTEC) 75 MG TBDP Take 1 tablet (75 mg total) by mouth every other day. 16 tablet 11   rizatriptan (MAXALT) 10 MG tablet Take 1 tablet (10 mg total) by mouth as needed for migraine. May repeat in 2 hours if needed.  Maximum 2 tablets in 24 hours. 30 tablet 3   topiramate (TOPAMAX) 50 MG tablet Take 100 mg by mouth 2 (two) times daily.     No current facility-administered medications on file prior to visit.  ALLERGIES: No Known Allergies  FAMILY HISTORY: Family History  Problem Relation Age of Onset   Ataxia Neg Hx    Chorea Neg Hx    Dementia Neg Hx    Mental retardation Neg Hx    Migraines Neg Hx    Multiple sclerosis Neg Hx    Neurofibromatosis Neg Hx    Neuropathy Neg Hx    Parkinsonism Neg Hx    Seizures Neg Hx     Stroke Neg Hx       Objective:  *** General: No acute distress.  Patient appears well-groomed.   Head:  Normocephalic/atraumatic Eyes:  Fundi examined but not visualized Neck: supple, no paraspinal tenderness, full range of motion Heart:  Regular rate and rhythm Neurological Exam: ***   Shon Millet, DO  CC: Abigail Gemma, PA-C

## 2023-07-16 ENCOUNTER — Ambulatory Visit (INDEPENDENT_AMBULATORY_CARE_PROVIDER_SITE_OTHER): Payer: 59 | Admitting: Neurology

## 2023-07-16 ENCOUNTER — Encounter: Payer: Self-pay | Admitting: Neurology

## 2023-07-16 VITALS — BP 130/84 | HR 62 | Ht 65.0 in | Wt 153.0 lb

## 2023-07-16 DIAGNOSIS — G43009 Migraine without aura, not intractable, without status migrainosus: Secondary | ICD-10-CM

## 2023-07-16 NOTE — Patient Instructions (Signed)
Continue Ajovy Rizatriptan as needed

## 2023-10-02 ENCOUNTER — Encounter: Payer: Self-pay | Admitting: Neurology

## 2023-10-03 ENCOUNTER — Telehealth: Payer: Self-pay

## 2023-10-03 NOTE — Telephone Encounter (Signed)
Medication Samples have been provided to the patient.  Drug name: Ajovy       Strength: 225 mg        Qty: 1 LOT: [id number]  Exp.Date: 5/26  Dosing instructions: every 28 days  The patient has been instructed regarding the correct time, dose, and frequency of taking this medication, including desired effects and most common side effects.   Leida Lauth 1:34 PM 10/03/2023

## 2023-10-10 ENCOUNTER — Encounter: Payer: Self-pay | Admitting: Neurology

## 2023-11-17 ENCOUNTER — Other Ambulatory Visit: Payer: Self-pay | Admitting: Neurology

## 2023-11-28 NOTE — Progress Notes (Unsigned)
 NEUROLOGY FOLLOW UP OFFICE NOTE  [name] [id number]  Assessment/Plan:   Migraine without aura, without status migrainosus, not intractable - migraines appear to have increased.   Migraine prevention:  Ajovy.  We will see if she can use a new copay card.  If not, we will need to switch to another CGRP inhibitor with better coverage.   Migraine rescue:  rizatriptan 10mg    Limit use of pain relievers to no more than 2 days out of week to prevent risk of rebound or medication-overuse headache. Keep headache diary Follow up 6 months.       Subjective:  [name] is a 53 year old female with hypothyroidism who follows up for migraines.   UPDATE She has seen an increase in cost of Ajovy.  Last month it was $200.  This month it was $400.  Intensity:  more mild than severe Duration:  30-40 minutes  with rizatriptan Frequency:  3 to 4 a  month Current NSAIDS/analgesics:  none Current triptans:  rizatriptan 10mg  Current ergotamine:  none Current anti-emetic:  none Current muscle relaxants:  none Current Antihypertensive medications:  none Current Antidepressant medications:  Wellbutrin Current Anticonvulsant medications:  topiramate 100mg  BID Current anti-CGRP: Ajovy Current Vitamins/Herbal/Supplements:  none Current Antihistamines/Decongestants:  none Other therapy:  beaded ice pack birth control:  none Other medications:  levothyroxine   Caffeine:  Tea.  No coffee Diet:  Drinks a lot of water.  Occasional soda.  Does not skip meals Exercise:  not Depression:  depression; Anxiety:  depression Other pain:  none Sleep hygiene:  light sleeper.  Wakes up often   HISTORY:  Onset:  in her 30s. Location:  sides of head bilaterally Quality:  throbbing Intensity:  8-9/10. More recently waking her up at night.  Aura:  absent Prodrome:  absent Associated symptoms:  None.  She denies associated nausea, vomiting, photophobia, phonophobia, osmophobia, visual  disturbance, autonomic symptomsunilateral numbness or weakness. Duration:  4-5 hours or longer Frequency:  6-7 days a month Triggers:  Unknown.  Laying down makes it worse Relieving factors:  Sitting up, beaded ice pack on head/face Activity:  aggravates   MRI of brain with and without contrast on 03/26/2022 was unremarkable.       Past NSAIDS/analgesics:  ibuprofen, Tylenol, Goody powder Past abortive triptans:  sumatriptan tab Past abortive ergotamine:  none Past muscle relaxants:  none Past anti-emetic:  none Past antihypertensive medications:  propranolol (hypotension) Past antidepressant medications:  sertraline Past anticonvulsant medications:  none Past anti-CGRP:  Nurtec QOD Other past therapies:  none     Family history of headache:  cousin  PAST MEDICAL HISTORY: Past Medical History:  Diagnosis Date   History of endometrial ablation    Migraines    Thyroid disease     MEDICATIONS: Current Outpatient Medications on File Prior to Visit  Medication Sig Dispense Refill   buPROPion (WELLBUTRIN XL) 300 MG 24 hr tablet Take 300 mg by mouth every morning.     Fremanezumab-vfrm (AJOVY) 225 MG/1.5ML SOAJ Inject 225 mg into the skin every 28 (twenty-eight) days. 3 mL 1   levothyroxine (SYNTHROID) 88 MCG tablet Take 88 mcg by mouth daily. (Patient not taking: Reported on 07/16/2023)     Rimegepant Sulfate (NURTEC) 75 MG TBDP Take 1 tablet (75 mg total) by mouth every other day. (Patient not taking: Reported on 07/16/2023) 16 tablet 11   rizatriptan (MAXALT) 10 MG tablet TAKE 1 TABLET AS NEEDED FOR MIGRAINE. MAY REPEAT IN 2  HOURS IF NEEDED. MAX 2 TABLETS IN 24 HOURS 30 tablet 2   topiramate (TOPAMAX) 50 MG tablet Take 100 mg by mouth 2 (two) times daily.     No current facility-administered medications on file prior to visit.    ALLERGIES: No Known Allergies  FAMILY HISTORY: Family History  Problem Relation Age of Onset   Ataxia Neg Hx    Chorea Neg Hx    Dementia  Neg Hx    Mental retardation Neg Hx    Migraines Neg Hx    Multiple sclerosis Neg Hx    Neurofibromatosis Neg Hx    Neuropathy Neg Hx    Parkinsonism Neg Hx    Seizures Neg Hx    Stroke Neg Hx       Objective:  Blood pressure 124/73, pulse 66, height 5\' 6"  (1.676 m), weight 150 lb (68 kg), SpO2 100%. General: No acute distress.  Patient appears well-groomed.   Head:  Normocephalic/atraumatic Neck:  Supple.  No paraspinal tenderness.  Full range of motion.. Neuro:  Alert and oriented.  Speech fluent and not dysarthric.  Language intact.  CN II-XII intact.  Bulk and tone normal.  Muscle strength 5/5 throughout.  Deep tendon reflexes 2+ throughout.  Gait normal.  Romberg negative.   Shon Millet, DO  CC: [name], PA-C

## 2023-11-29 ENCOUNTER — Ambulatory Visit (INDEPENDENT_AMBULATORY_CARE_PROVIDER_SITE_OTHER): Admitting: Neurology

## 2023-11-29 ENCOUNTER — Encounter: Payer: Self-pay | Admitting: Neurology

## 2023-11-29 VITALS — BP 124/73 | HR 66 | Ht 66.0 in | Wt 150.0 lb

## 2023-11-29 DIAGNOSIS — G43009 Migraine without aura, not intractable, without status migrainosus: Secondary | ICD-10-CM

## 2023-11-29 MED ORDER — AJOVY 225 MG/1.5ML ~~LOC~~ SOAJ: 225.0000 mg | SUBCUTANEOUS | 1 refills | Status: DC

## 2023-11-29 NOTE — Patient Instructions (Signed)
 Ajovy - try the copay card.  If price doesn't come down, let me know and we will need to change to another medication Rizatriptan as needed

## 2024-01-15 ENCOUNTER — Ambulatory Visit: Payer: 59 | Admitting: Neurology

## 2024-01-21 ENCOUNTER — Other Ambulatory Visit (HOSPITAL_COMMUNITY): Payer: Self-pay | Admitting: Family Medicine

## 2024-01-21 DIAGNOSIS — Z1231 Encounter for screening mammogram for malignant neoplasm of breast: Secondary | ICD-10-CM

## 2024-01-27 ENCOUNTER — Ambulatory Visit (HOSPITAL_COMMUNITY)
Admission: RE | Admit: 2024-01-27 | Discharge: 2024-01-27 | Disposition: A | Discharge status: Home / Self Care | Source: Ambulatory Visit | Attending: Family Medicine | Admitting: Family Medicine

## 2024-01-27 ENCOUNTER — Encounter (HOSPITAL_COMMUNITY): Payer: Self-pay

## 2024-01-27 DIAGNOSIS — Z1231 Encounter for screening mammogram for malignant neoplasm of breast: Secondary | ICD-10-CM | POA: Diagnosis present

## 2024-02-07 LAB — COLOGUARD: COLOGUARD: NEGATIVE

## 2024-02-12 ENCOUNTER — Ambulatory Visit: Payer: 59 | Admitting: Neurology

## 2024-02-17 ENCOUNTER — Other Ambulatory Visit: Payer: Self-pay | Admitting: Neurology

## 2024-03-24 ENCOUNTER — Ambulatory Visit: Admitting: Neurology

## 2024-04-15 ENCOUNTER — Encounter: Payer: Self-pay | Admitting: Neurology

## 2024-06-09 NOTE — Progress Notes (Unsigned)
 Virtual Visit via Video Note  Consent was obtained for video visit:  Yes.   Answered questions that patient had about telehealth interaction:  Yes.   I discussed the limitations, risks, security and privacy concerns of performing an evaluation and management service by telemedicine. I also discussed with the patient that there may be a patient responsible charge related to this service. The patient expressed understanding and agreed to proceed.  Pt location: Home Physician Location: office Name of referring provider:  Debrah Josette ORN., PA-C I connected with [name] at patients initiation/request on 06/10/2024 at  9:50 AM EDT by video enabled telemedicine application and verified that I am speaking with the correct person using two identifiers. Pt MRN:  [id number] Pt DOB:  [birth year] Video Participants:  [name]  Assessment/Plan:   Migraine without aura, without status migrainosus, not intractable - migraines appear to have increased.   Migraine prevention:  Ajovy  Migraine rescue:  rizatriptan  10mg  Lifestyle modification: Limit use of pain relievers to no more than 9 days out of the month to prevent risk of rebound or medication-overuse headache. Diet modification/hydration/caffeine cessation Routine exercise Sleep hygiene Consider vitamins/supplements:  magnesium citrate 400mg  daily, riboflavin 400mg  daily, CoQ10 100mg  three times daily Keep headache diary Follow up one year        Subjective:  [name] is a 53 year old female with hypothyroidism who follows up for migraines.   UPDATE She has seen an increase in cost of Ajovy .  Last month it was $200.  This month it was $400.  Intensity:  more mild than severe Duration:  30-40 minutes  with rizatriptan  Frequency:  3 to 4 a  month Current NSAIDS/analgesics:  none Current triptans:  rizatriptan  10mg  Current ergotamine:  none Current anti-emetic:  none Current muscle relaxants:  none Current  Antihypertensive medications:  none Current Antidepressant medications:  Wellbutrin Current Anticonvulsant medications:  topiramate 100mg  BID Current anti-CGRP: Ajovy  Current Vitamins/Herbal/Supplements:  none Current Antihistamines/Decongestants:  none Other therapy:  beaded ice pack birth control:  none Other medications:  levothyroxine   Caffeine:  Tea.  No coffee Diet:  Drinks a lot of water.  Occasional soda.  Does not skip meals Exercise:  not Depression:  depression; Anxiety:  depression Other pain:  none Sleep hygiene:  light sleeper.  Wakes up often   HISTORY:  Onset:  in her 30s. Location:  sides of head bilaterally Quality:  throbbing Intensity:  8-9/10. More recently waking her up at night.  Aura:  absent Prodrome:  absent Associated symptoms:  None.  She denies associated nausea, vomiting, photophobia, phonophobia, osmophobia, visual disturbance, autonomic symptomsunilateral numbness or weakness. Duration:  4-5 hours or longer Frequency:  6-7 days a month Triggers:  Unknown.  Laying down makes it worse Relieving factors:  Sitting up, beaded ice pack on head/face Activity:  aggravates   MRI of brain with and without contrast on 03/26/2022 was unremarkable.       Past NSAIDS/analgesics:  ibuprofen, Tylenol, Goody powder Past abortive triptans:  sumatriptan  tab Past abortive ergotamine:  none Past muscle relaxants:  none Past anti-emetic:  none Past antihypertensive medications:  propranolol  (hypotension) Past antidepressant medications:  sertraline Past anticonvulsant medications:  none Past anti-CGRP:  Nurtec QOD Other past therapies:  none     Family history of headache:  cousin  Past Medical History: Past Medical History:  Diagnosis Date   History of endometrial ablation    Migraines    Thyroid disease  Medications: Outpatient Encounter Medications as of 06/10/2024  Medication Sig   buPROPion (WELLBUTRIN XL) 300 MG 24 hr tablet Take 300 mg by  mouth every morning.   Fremanezumab -vfrm (AJOVY ) 225 MG/1.5ML SOAJ INJECT 225 MG INTO THE SKIN EVERY 28 (TWENTY-EIGHT) DAYS.   levothyroxine (SYNTHROID) 88 MCG tablet Take 88 mcg by mouth daily.   rizatriptan  (MAXALT ) 10 MG tablet TAKE 1 TABLET AS NEEDED FOR MIGRAINE. MAY REPEAT IN 2 HOURS IF NEEDED. MAX 2 TABLETS IN 24 HOURS   topiramate (TOPAMAX) 50 MG tablet Take 100 mg by mouth 2 (two) times daily.   No facility-administered encounter medications on file as of 06/10/2024.    Allergies: No Known Allergies  Family History: Family History  Problem Relation Age of Onset   Ataxia Neg Hx    Chorea Neg Hx    Dementia Neg Hx    Mental retardation Neg Hx    Migraines Neg Hx    Multiple sclerosis Neg Hx    Neurofibromatosis Neg Hx    Neuropathy Neg Hx    Parkinsonism Neg Hx    Seizures Neg Hx    Stroke Neg Hx     Observations/Objective:   No acute distress.  Alert and oriented.  Speech fluent and not dysarthric.  Language intact.  Eyes orthophoric on primary gaze.  Face symmetric.   Follow Up Instructions:    -I discussed the assessment and treatment plan with the patient. The patient was provided an opportunity to ask questions and all were answered. The patient agreed with the plan and demonstrated an understanding of the instructions.   The patient was advised to call back or seek an in-person evaluation if the symptoms worsen or if the condition fails to improve as anticipated.   Juliene Lamar Dunnings, DO

## 2024-06-10 ENCOUNTER — Telehealth (INDEPENDENT_AMBULATORY_CARE_PROVIDER_SITE_OTHER): Admitting: Neurology

## 2024-06-10 ENCOUNTER — Encounter: Payer: Self-pay | Admitting: Neurology

## 2024-06-10 VITALS — Ht 64.0 in | Wt 146.0 lb

## 2024-06-10 DIAGNOSIS — G43009 Migraine without aura, not intractable, without status migrainosus: Secondary | ICD-10-CM

## 2024-06-12 ENCOUNTER — Other Ambulatory Visit: Payer: Self-pay | Admitting: Neurology

## 2024-06-15 ENCOUNTER — Ambulatory Visit: Admitting: Neurology

## 2025-06-14 ENCOUNTER — Ambulatory Visit: Admitting: Neurology
